# Patient Record
Sex: Female | Born: 1955 | Race: White | Hispanic: No | Marital: Married | Smoking: Never smoker
Health system: Southern US, Community
[De-identification: ages and names within clinical notes are randomized; demographics above are authoritative.]

## PROBLEM LIST (undated history)

## (undated) DIAGNOSIS — C801 Malignant (primary) neoplasm, unspecified: Secondary | ICD-10-CM

## (undated) DIAGNOSIS — Z923 Personal history of irradiation: Secondary | ICD-10-CM

---

## 2010-09-21 HISTORY — PX: BREAST BIOPSY: SHX20

## 2017-05-06 DIAGNOSIS — R002 Palpitations: Secondary | ICD-10-CM | POA: Insufficient documentation

## 2017-05-06 DIAGNOSIS — Z1239 Encounter for other screening for malignant neoplasm of breast: Secondary | ICD-10-CM | POA: Insufficient documentation

## 2017-05-27 DIAGNOSIS — C73 Malignant neoplasm of thyroid gland: Secondary | ICD-10-CM | POA: Insufficient documentation

## 2017-05-27 DIAGNOSIS — E785 Hyperlipidemia, unspecified: Secondary | ICD-10-CM | POA: Insufficient documentation

## 2017-05-27 DIAGNOSIS — M8580 Other specified disorders of bone density and structure, unspecified site: Secondary | ICD-10-CM | POA: Insufficient documentation

## 2017-05-27 DIAGNOSIS — R928 Other abnormal and inconclusive findings on diagnostic imaging of breast: Secondary | ICD-10-CM | POA: Insufficient documentation

## 2018-05-03 DIAGNOSIS — Z85850 Personal history of malignant neoplasm of thyroid: Secondary | ICD-10-CM | POA: Insufficient documentation

## 2018-05-03 DIAGNOSIS — E890 Postprocedural hypothyroidism: Secondary | ICD-10-CM | POA: Insufficient documentation

## 2019-10-31 ENCOUNTER — Other Ambulatory Visit: Payer: Self-pay | Admitting: Gerontology

## 2019-10-31 DIAGNOSIS — E559 Vitamin D deficiency, unspecified: Secondary | ICD-10-CM | POA: Insufficient documentation

## 2019-10-31 DIAGNOSIS — Z1231 Encounter for screening mammogram for malignant neoplasm of breast: Secondary | ICD-10-CM

## 2019-10-31 DIAGNOSIS — Z6841 Body Mass Index (BMI) 40.0 and over, adult: Secondary | ICD-10-CM | POA: Insufficient documentation

## 2019-10-31 DIAGNOSIS — Z9889 Other specified postprocedural states: Secondary | ICD-10-CM | POA: Insufficient documentation

## 2019-11-01 ENCOUNTER — Ambulatory Visit
Admission: RE | Admit: 2019-11-01 | Discharge: 2019-11-01 | Disposition: A | Discharge status: Home / Self Care | Payer: 59 | Source: Ambulatory Visit | Attending: Gerontology | Admitting: Gerontology

## 2019-11-01 ENCOUNTER — Other Ambulatory Visit: Payer: Self-pay

## 2019-11-01 ENCOUNTER — Encounter (INDEPENDENT_AMBULATORY_CARE_PROVIDER_SITE_OTHER): Payer: Self-pay

## 2019-11-01 DIAGNOSIS — Z1231 Encounter for screening mammogram for malignant neoplasm of breast: Secondary | ICD-10-CM | POA: Insufficient documentation

## 2019-11-01 HISTORY — DX: Personal history of irradiation: Z92.3

## 2019-11-01 HISTORY — DX: Malignant (primary) neoplasm, unspecified: C80.1

## 2020-05-07 DIAGNOSIS — R7303 Prediabetes: Secondary | ICD-10-CM | POA: Insufficient documentation

## 2021-01-29 ENCOUNTER — Other Ambulatory Visit: Payer: Self-pay | Admitting: Gerontology

## 2021-01-29 DIAGNOSIS — Z1231 Encounter for screening mammogram for malignant neoplasm of breast: Secondary | ICD-10-CM

## 2021-02-03 ENCOUNTER — Ambulatory Visit
Admission: RE | Admit: 2021-02-03 | Discharge: 2021-02-03 | Disposition: A | Discharge status: Home / Self Care | Payer: Commercial Managed Care - PPO | Source: Ambulatory Visit | Attending: Gerontology | Admitting: Gerontology

## 2021-02-03 ENCOUNTER — Other Ambulatory Visit: Payer: Self-pay

## 2021-02-03 DIAGNOSIS — Z1231 Encounter for screening mammogram for malignant neoplasm of breast: Secondary | ICD-10-CM | POA: Insufficient documentation

## 2022-01-14 ENCOUNTER — Other Ambulatory Visit (HOSPITAL_COMMUNITY): Payer: Self-pay

## 2022-02-06 ENCOUNTER — Other Ambulatory Visit: Payer: Self-pay | Admitting: Gerontology

## 2022-02-06 ENCOUNTER — Other Ambulatory Visit: Payer: Self-pay

## 2022-02-06 DIAGNOSIS — Z1231 Encounter for screening mammogram for malignant neoplasm of breast: Secondary | ICD-10-CM

## 2022-02-06 DIAGNOSIS — R748 Abnormal levels of other serum enzymes: Secondary | ICD-10-CM | POA: Insufficient documentation

## 2022-02-09 ENCOUNTER — Other Ambulatory Visit: Payer: Self-pay | Admitting: Gerontology

## 2022-02-09 DIAGNOSIS — R748 Abnormal levels of other serum enzymes: Secondary | ICD-10-CM

## 2022-02-12 ENCOUNTER — Other Ambulatory Visit: Payer: Self-pay | Admitting: Gerontology

## 2022-02-12 DIAGNOSIS — M8580 Other specified disorders of bone density and structure, unspecified site: Secondary | ICD-10-CM

## 2022-02-20 ENCOUNTER — Ambulatory Visit
Admission: RE | Admit: 2022-02-20 | Discharge: 2022-02-20 | Disposition: A | Discharge status: Home / Self Care | Payer: Commercial Managed Care - PPO | Source: Ambulatory Visit | Attending: Gerontology | Admitting: Gerontology

## 2022-02-20 DIAGNOSIS — R748 Abnormal levels of other serum enzymes: Secondary | ICD-10-CM | POA: Diagnosis not present

## 2022-03-11 ENCOUNTER — Ambulatory Visit
Admission: RE | Admit: 2022-03-11 | Discharge: 2022-03-11 | Disposition: A | Discharge status: Home / Self Care | Payer: Commercial Managed Care - PPO | Source: Ambulatory Visit | Attending: Gerontology | Admitting: Gerontology

## 2022-03-11 DIAGNOSIS — Z1231 Encounter for screening mammogram for malignant neoplasm of breast: Secondary | ICD-10-CM | POA: Diagnosis not present

## 2022-03-17 ENCOUNTER — Ambulatory Visit
Admission: RE | Admit: 2022-03-17 | Discharge: 2022-03-17 | Disposition: A | Discharge status: Home / Self Care | Payer: Commercial Managed Care - PPO | Source: Ambulatory Visit | Attending: Gerontology | Admitting: Gerontology

## 2022-03-17 DIAGNOSIS — M8580 Other specified disorders of bone density and structure, unspecified site: Secondary | ICD-10-CM | POA: Insufficient documentation

## 2023-02-08 ENCOUNTER — Ambulatory Visit
Admission: RE | Admit: 2023-02-08 | Discharge: 2023-02-08 | Disposition: A | Discharge status: Home / Self Care | Payer: Medicare Other | Source: Ambulatory Visit

## 2023-02-08 ENCOUNTER — Ambulatory Visit (INDEPENDENT_AMBULATORY_CARE_PROVIDER_SITE_OTHER): Payer: Medicare Other

## 2023-02-08 VITALS — BP 168/124 | HR 105

## 2023-02-08 DIAGNOSIS — R432 Parageusia: Secondary | ICD-10-CM

## 2023-02-08 DIAGNOSIS — R062 Wheezing: Secondary | ICD-10-CM | POA: Diagnosis not present

## 2023-02-08 DIAGNOSIS — J209 Acute bronchitis, unspecified: Secondary | ICD-10-CM

## 2023-02-08 DIAGNOSIS — H6691 Otitis media, unspecified, right ear: Secondary | ICD-10-CM | POA: Diagnosis not present

## 2023-02-08 LAB — SARS CORONAVIRUS 2 BY RT PCR: SARS Coronavirus 2 by RT PCR: NEGATIVE

## 2023-02-08 MED ORDER — ALBUTEROL SULFATE HFA 108 (90 BASE) MCG/ACT IN AERS: 2.0000 | INHALATION_SPRAY | RESPIRATORY_TRACT | 0 refills | Status: AC | PRN

## 2023-02-08 MED ORDER — PREDNISONE 20 MG PO TABS: 20.0000 mg | ORAL_TABLET | Freq: Every day | ORAL | 0 refills | Status: AC

## 2023-02-08 MED ORDER — IPRATROPIUM-ALBUTEROL 0.5-2.5 (3) MG/3ML IN SOLN
3.0000 mL | Freq: Once | RESPIRATORY_TRACT | Status: AC
  Administered 2023-02-08: 3 mL via RESPIRATORY_TRACT

## 2023-02-08 MED ORDER — AMOXICILLIN-POT CLAVULANATE 875-125 MG PO TABS: 1.0000 | ORAL_TABLET | Freq: Two times a day (BID) | ORAL | 0 refills | Status: AC

## 2023-02-08 NOTE — ED Triage Notes (Signed)
Pt c/o chest congestion, bilateral ears stopped onset Thursday. Pt states prior ot the  was having stuffy nose and sinus issues going on. Pt took a decongestant on Thursday and did help with sinus issues.

## 2023-02-08 NOTE — ED Provider Notes (Addendum)
[facility]    CSN: [id number] Arrival date & time: 02/08/23  1338      History   Chief Complaint Chief Complaint  Patient presents with   Ear Fullness    Chest tightness and congestion Loss of smell and taste - Entered by patient    HPI Linda Fitzgerald is a 67 y.o. female who presents with onset of chest congestion, bilateral ears feeling stuffy x 4 days. She was around her brother who was also coughing last week when she traveled to [place]. She tool Ibuprofen-sudafed combo when it started and seemed to help his sinuses. Her L ear has pain and decreased hearing in both L>R. Has not had a fever, but been fatigued, nauseous and lost her taste. She has never had Covid, and had 2 covid shots 2 years ago.     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      Home Medications    Prior to Admission medications   Medication Sig Start Date End Date Taking? Authorizing Provider  albuterol (VENTOLIN HFA) 108 (90 Base) MCG/ACT inhaler Inhale 2 puffs into the lungs every 4 (four) hours as needed for wheezing or shortness of breath. 02/08/23  Yes Rodriguez-Southworth, Nettie Elm, PA-C  amoxicillin-clavulanate (AUGMENTIN) 875-125 MG tablet Take 1 tablet by  mouth every 12 (twelve) hours. 02/08/23  Yes Rodriguez-Southworth, Nettie Elm, PA-C  Calcium Carb-Cholecalciferol 500-10 MG-MCG TABS Take by mouth. 03/18/22 03/18/23 Yes [provider]  levothyroxine (SYNTHROID) 100 MCG tablet Take by mouth. 07/04/19 11/23/23 Yes [provider]  predniSONE (DELTASONE) 20 MG tablet Take 1 tablet (20 mg total) by mouth daily with breakfast. 02/08/23  Yes Rodriguez-Southworth, Nettie Elm, PA-C    Family History Family History  Problem Relation Age of Onset   Breast cancer Neg Hx     Social History Social History   Tobacco Use   Smoking status: Never   Smokeless tobacco: Never  Vaping Use   Vaping Use: Never used  Substance Use Topics   Alcohol use: Yes   Drug use: Never     Allergies   Lovastatin   Review of Systems Review of Systems  As noted in HPI Physical Exam Triage Vital Signs ED Triage Vitals  Enc Vitals Group     BP 02/08/23 1401 (S) (!) 168/124     Pulse Rate 02/08/23 1401 (!) 105     Resp --      Temp --      Temp Source 02/08/23 1401 Oral     SpO2 02/08/23 1401 94 %     Weight --      Height --      Head Circumference --      Peak Flow --  Pain Score 02/08/23 1359 3     Pain Loc --      Pain Edu? --      Excl. in GC? --    No data found.  Updated Vital Signs BP (S) (!) 168/124 (BP Location: Right Arm)   Pulse (!) 105   SpO2 94%  I repeated her pulse ox after neg and went up to 98% Visual Acuity Right Eye Distance:   Left Eye Distance:   Bilateral Distance:    Right Eye Near:   Left Eye Near:    Bilateral Near:     Physical Exam Physical Exam Constitutional:      General: He is not in acute distress.    Appearance: He is not toxic-appearing.  HENT:     Head: Normocephalic.     Right Ear: Tympanic membrane little pink and dull,  but ear canal and external ear normal.     Left Ear: Ear canal and external ear normal. L TM is red and dull.     Nose: Nose normal.     Mouth/Throat: clear     Mouth: Mucous membranes are moist.     Pharynx: Oropharynx is clear.  Eyes:     General: No scleral icterus.    Conjunctiva/sclera: Conjunctivae normal.  Cardiovascular:     Rate and Rhythm: Normal rate and regular rhythm.     Heart sounds: No murmur heard.   Pulmonary:     Effort: Pulmonary effort is normal. No respiratory distress.     Breath sounds: Wheezing present all over which improved after the neb treatment.     Comments: Has auditory wheezing Musculoskeletal:        General: Normal range of motion.     Cervical back: Neck supple.  Lymphadenopathy:     Cervical: No cervical adenopathy.  Skin:    General: Skin is warm and dry.     Findings: No rash.  Neurological:     Mental Status: He is alert and oriented to person, place, and time.     Gait: Gait normal.  Psychiatric:        Mood and Affect: Mood normal.        Behavior: Behavior normal.        Thought Content: Thought content normal.        Judgment: Judgment normal.    UC Treatments / Results  Labs (all labs ordered are listed, but only abnormal results are displayed) Labs Reviewed  SARS CORONAVIRUS 2 BY RT PCR    EKG   Radiology DG Chest 2 View  Result Date: 02/08/2023 CLINICAL DATA:  Shortness of breath, wheezing. EXAM: CHEST - 2 VIEW COMPARISON:  None Available. FINDINGS: Clear lungs. Normal heart size and mediastinal contours. No pleural effusion or pneumothorax. Visualized bones and upper abdomen are unremarkable. IMPRESSION: No evidence of acute cardiopulmonary disease. Electronically Signed   By: Orvan Falconer M.D.   On: 02/08/2023 16:27    Procedures Procedures (including critical care time)  Medications Ordered in UC Medications  ipratropium-albuterol (DUONEB) 0.5-2.5 (3) MG/3ML nebulizer solution 3 mL (3 mLs Nebulization Given 02/08/23 1632)    Initial Impression / Assessment and Plan / UC Course  I have reviewed the triage vital signs and the nursing notes. She was given Duo neb  treatment and her pulse ox after that went up to  Pertinent labs & imaging results that were available during my care of the patient were reviewed by me and considered in my medical decision  making (see chart for details). Covid test ordered and pending results, we will call her if positive  Acute bronchitis L OM  I placed her on Augmentin, Prednisone and Albuterol inhaler as noted.    Final Clinical Impressions(s) / UC Diagnoses   Final diagnoses:  Acute otitis media, right  Wheezing  Loss of taste  Acute bronchitis, unspecified organism     Discharge Instructions      Use the inhaler for 5-7 days     ED Prescriptions     Medication Sig Dispense Auth. Provider   amoxicillin-clavulanate (AUGMENTIN) 875-125 MG tablet Take 1 tablet by mouth every 12 (twelve) hours. 14 tablet Rodriguez-Southworth, Nettie Elm, PA-C   predniSONE (DELTASONE) 20 MG tablet Take 1 tablet (20 mg total) by mouth daily with breakfast. 5 tablet Rodriguez-Southworth, Nettie Elm, PA-C   albuterol (VENTOLIN HFA) 108 (90 Base) MCG/ACT inhaler Inhale 2 puffs into the lungs every 4 (four) hours as needed for wheezing or shortness of breath. 18 g Rodriguez-Southworth, Nettie Elm, PA-C      PDMP not reviewed this encounter.   Garey Ham, PA-C 02/08/23 1716    Rodriguez-Southworth, Nettie Elm, PA-C 02/08/23 1757

## 2023-02-08 NOTE — Discharge Instructions (Addendum)
Use the inhaler for 5-7 days

## 2023-03-16 ENCOUNTER — Other Ambulatory Visit: Payer: Self-pay | Admitting: Gerontology

## 2023-03-16 DIAGNOSIS — Z1231 Encounter for screening mammogram for malignant neoplasm of breast: Secondary | ICD-10-CM

## 2023-04-05 ENCOUNTER — Ambulatory Visit
Admission: RE | Admit: 2023-04-05 | Discharge: 2023-04-05 | Disposition: A | Discharge status: Home / Self Care | Payer: Medicare Other | Source: Ambulatory Visit | Attending: Gerontology | Admitting: Gerontology

## 2023-04-05 DIAGNOSIS — Z1231 Encounter for screening mammogram for malignant neoplasm of breast: Secondary | ICD-10-CM | POA: Insufficient documentation

## 2023-05-03 IMAGING — MG MM DIGITAL SCREENING BILAT W/ TOMO AND CAD
8 series · 8 of 24 positions shown · non-contrast
Comparison: Previous exam(s).

CLINICAL DATA: Screening.

EXAM:
DIGITAL SCREENING BILATERAL MAMMOGRAM WITH TOMOSYNTHESIS AND CAD
TECHNIQUE: Bilateral screening digital craniocaudal and mediolateral oblique
mammograms were obtained. Bilateral screening digital breast
tomosynthesis was performed. The images were evaluated with
computer-aided detection.

[R CC synth-2D]
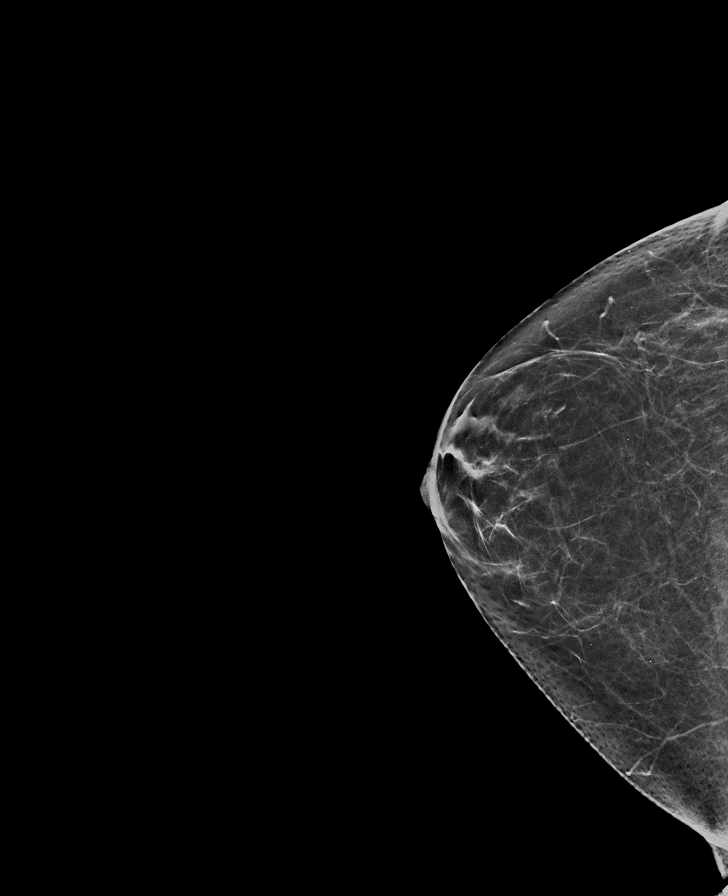

[L MLO synth-2D]
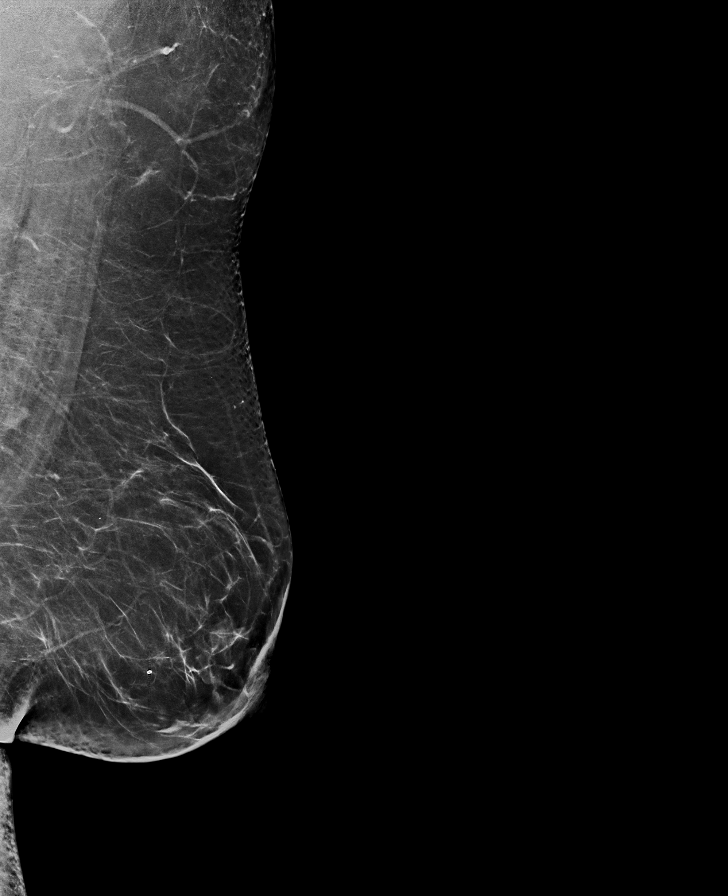

[L CC synth-2D]
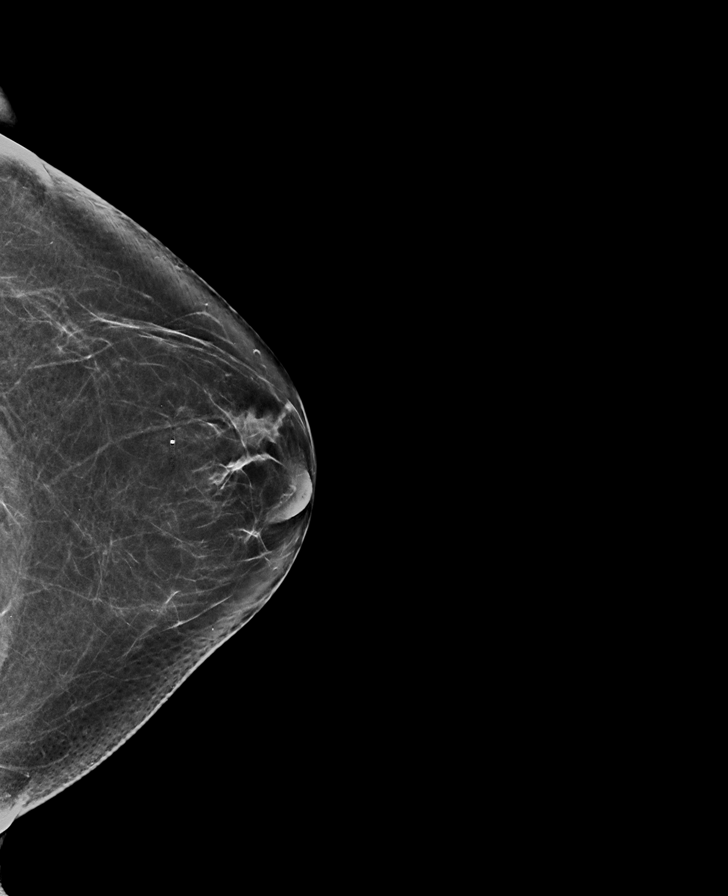

[R MLO synth-2D]
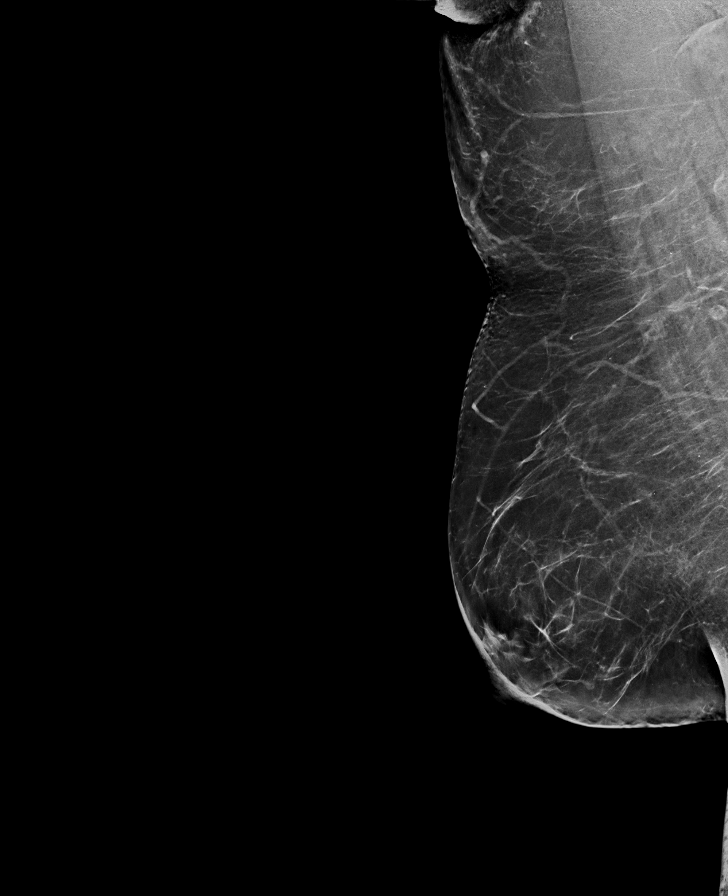

[R CC tomo · tomo slice 31/61.0]
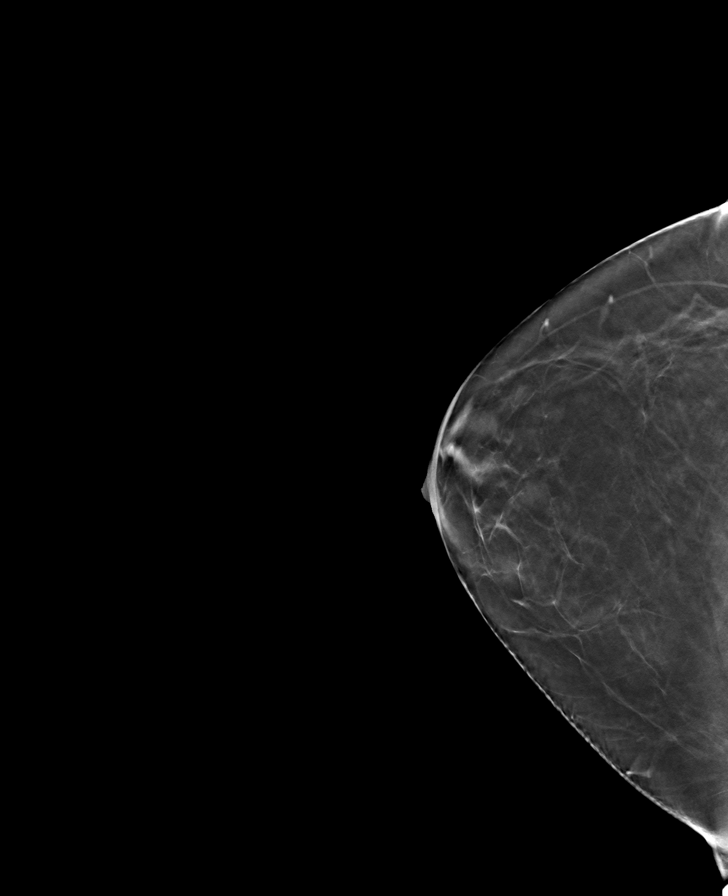

[L MLO tomo · tomo slice 39/77.0]
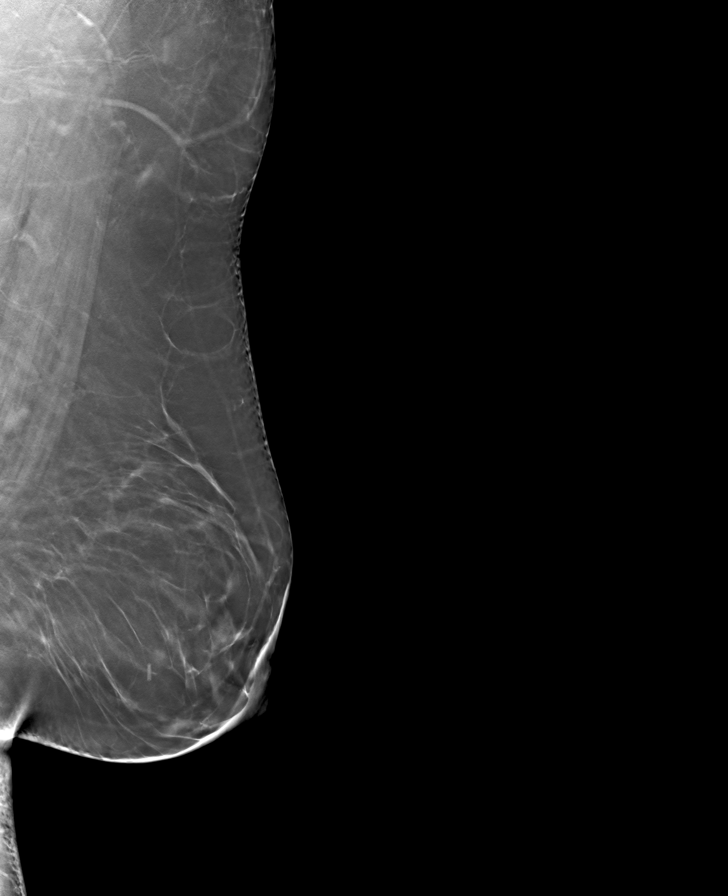

[R MLO tomo · tomo slice 39/78.0]
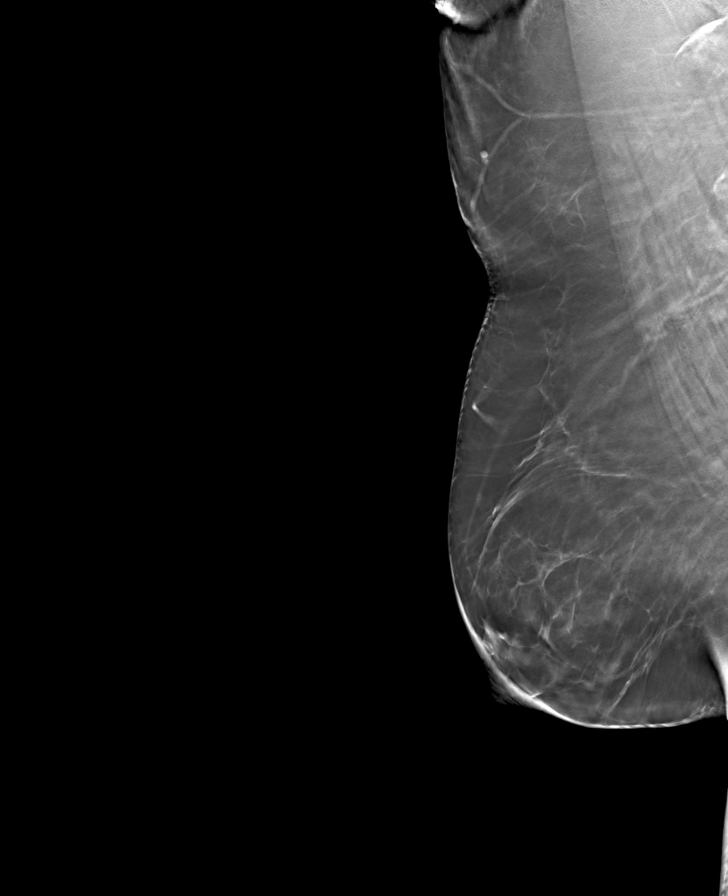

[L CC tomo · tomo slice 36/71.0]
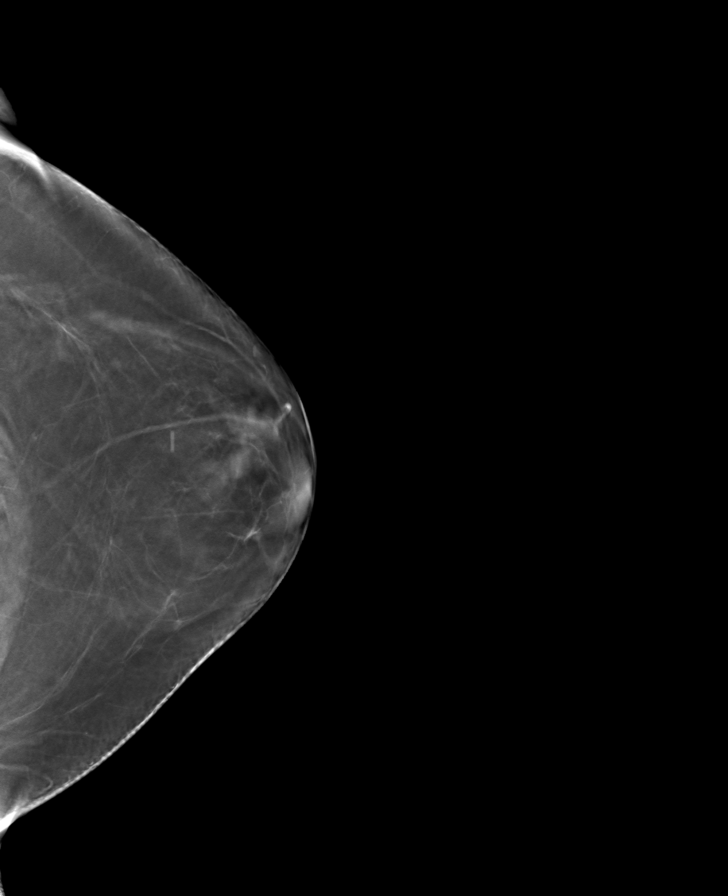

[8 of 24 positions shown; findings below may reference images not displayed]

ACR Breast Density Category b: There are scattered areas of
fibroglandular density.
FINDINGS: There are no findings suspicious for malignancy.
IMPRESSION: No mammographic evidence of malignancy. A result letter of this
screening mammogram will be mailed directly to the patient.

RECOMMENDATION:
Screening mammogram in one year. (Code:51-O-LD2)

BI-RADS CATEGORY  1: Negative.

## 2023-08-10 ENCOUNTER — Other Ambulatory Visit: Payer: Self-pay | Admitting: Medical Genetics

## 2023-08-10 DIAGNOSIS — Z006 Encounter for examination for normal comparison and control in clinical research program: Secondary | ICD-10-CM

## 2023-08-14 ENCOUNTER — Other Ambulatory Visit
Admission: RE | Admit: 2023-08-14 | Discharge: 2023-08-14 | Disposition: A | Discharge status: Home / Self Care | Payer: Self-pay | Source: Ambulatory Visit | Attending: Medical Genetics | Admitting: Medical Genetics

## 2023-08-14 DIAGNOSIS — Z006 Encounter for examination for normal comparison and control in clinical research program: Secondary | ICD-10-CM | POA: Insufficient documentation

## 2023-08-29 LAB — GENECONNECT MOLECULAR SCREEN: Genetic Analysis Overall Interpretation: NEGATIVE

## 2024-04-05 ENCOUNTER — Other Ambulatory Visit: Payer: Self-pay | Admitting: Gerontology

## 2024-04-05 DIAGNOSIS — Z1231 Encounter for screening mammogram for malignant neoplasm of breast: Secondary | ICD-10-CM

## 2024-04-18 ENCOUNTER — Ambulatory Visit
Admission: RE | Admit: 2024-04-18 | Discharge: 2024-04-18 | Disposition: A | Discharge status: Home / Self Care | Source: Ambulatory Visit | Attending: Gerontology | Admitting: Gerontology

## 2024-04-18 DIAGNOSIS — Z1231 Encounter for screening mammogram for malignant neoplasm of breast: Secondary | ICD-10-CM | POA: Diagnosis present
